# Patient Record
Sex: Female | Born: 1965 | Hispanic: Yes | Marital: Single | Smoking: Never smoker
Health system: Southern US, Community
[De-identification: ages and names within clinical notes are randomized; demographics above are authoritative.]

## PROBLEM LIST (undated history)

## (undated) DIAGNOSIS — I10 Essential (primary) hypertension: Secondary | ICD-10-CM

## (undated) DIAGNOSIS — E119 Type 2 diabetes mellitus without complications: Secondary | ICD-10-CM

## (undated) DIAGNOSIS — E7800 Pure hypercholesterolemia, unspecified: Secondary | ICD-10-CM

---

## 2004-10-18 ENCOUNTER — Emergency Department: Payer: Self-pay | Admitting: Emergency Medicine

## 2018-11-28 ENCOUNTER — Emergency Department
Admission: EM | Admit: 2018-11-28 | Discharge: 2018-11-28 | Disposition: A | Discharge status: Home / Self Care | Payer: Self-pay | Attending: Emergency Medicine | Admitting: Emergency Medicine

## 2018-11-28 ENCOUNTER — Other Ambulatory Visit: Payer: Self-pay

## 2018-11-28 ENCOUNTER — Encounter: Payer: Self-pay | Admitting: Emergency Medicine

## 2018-11-28 DIAGNOSIS — N61 Mastitis without abscess: Secondary | ICD-10-CM | POA: Insufficient documentation

## 2018-11-28 DIAGNOSIS — R6883 Chills (without fever): Secondary | ICD-10-CM | POA: Insufficient documentation

## 2018-11-28 MED ORDER — SULFAMETHOXAZOLE-TRIMETHOPRIM 800-160 MG PO TABS: 1.0000 | ORAL_TABLET | Freq: Two times a day (BID) | ORAL | 0 refills | Status: AC

## 2018-11-28 MED ORDER — CEPHALEXIN 500 MG PO CAPS: 500.0000 mg | ORAL_CAPSULE | Freq: Three times a day (TID) | ORAL | 0 refills | Status: AC

## 2018-11-28 NOTE — ED Triage Notes (Signed)
First Nurse Note:  C/O Left breast redness and pain x 3 days.  Redness seen to 1000-1200 above areola.

## 2018-11-28 NOTE — ED Provider Notes (Signed)
[facility] Emergency Department Provider Note  ____________________________________________  Time seen: Approximately 7:54 PM  I have reviewed the triage vital signs and the nursing notes.   HISTORY  Chief Complaint No chief complaint on file.    HPI Janice Colon is a 53 y.o. female presents to the emergency department with a left breast erythema and pain for the past 3 days.  Patient has had chills at home.  Patient denies a history of breast abscesses and has no history of diabetes or prior MRSA infections.  Patient has never had a nipple ring.  She denies discharge from the niple.  No personal history of breast cancer. Patient has been taking Tylenol.    History reviewed. No pertinent past medical history.  There are no active problems to display for this patient.   History reviewed. No pertinent surgical history.  Prior to Admission medications   Medication Sig Start Date End Date Taking? Authorizing Provider  cephALEXin (KEFLEX) 500 MG capsule Take 1 capsule (500 mg total) by mouth 3 (three) times daily for 7 days. 11/28/18 12/05/18  Orvil Feil, PA-C  sulfamethoxazole-trimethoprim (BACTRIM DS,SEPTRA DS) 800-160 MG tablet Take 1 tablet by mouth 2 (two) times daily for 7 days. 11/28/18 12/05/18  Orvil Feil, PA-C    Allergies Patient has no known allergies.  No family history on file.  Social History Social History   Tobacco Use  . Smoking status: Never Smoker  . Smokeless tobacco: Never Used  Substance Use Topics  . Alcohol use: Not on file  . Drug use: Not on file     Review of Systems  Constitutional: No fever/chills Eyes: No visual changes. No discharge ENT: No upper respiratory complaints. Cardiovascular: no chest pain. Respiratory: no cough. No SOB. Gastrointestinal: No abdominal pain.  No nausea, no vomiting.  No diarrhea.  No constipation. Genitourinary: Negative for dysuria. No hematuria Musculoskeletal:  Negative for musculoskeletal pain. Skin: Patient has left breast pain.  Neurological: Negative for headaches, focal weakness or numbness.   ____________________________________________   PHYSICAL EXAM:  VITAL SIGNS: ED Triage Vitals  Enc Vitals Group     BP 11/28/18 1757 (!) 156/85     Pulse Rate 11/28/18 1757 87     Resp 11/28/18 1757 18     Temp 11/28/18 1757 98.3 F (36.8 C)     Temp Source 11/28/18 1757 Oral     SpO2 11/28/18 1757 98 %     Weight 11/28/18 1749 175 lb (79.4 kg)     Height --      Head Circumference --      Peak Flow --      Pain Score 11/28/18 1749 7     Pain Loc --      Pain Edu? --      Excl. in GC? --      Constitutional: Alert and oriented. Well appearing and in no acute distress. Eyes: Conjunctivae are normal. PERRL. EOMI. Head: Atraumatic. Cardiovascular: Normal rate, regular rhythm. Normal S1 and S2.  Good peripheral circulation. Respiratory: Normal respiratory effort without tachypnea or retractions. Lungs CTAB. Good air entry to the bases with no decreased or absent breath sounds. Musculoskeletal: Full range of motion to all extremities. No gross deformities appreciated. Neurologic:  Normal speech and language. No gross focal neurologic deficits are appreciated.  Skin: Patient has 3 cm of circumferential cellulitis along the left upper quadrant of the breast superior to areola.  Mild induration is palpated but no fluctuance.  Psychiatric: Mood and affect are normal. Speech and behavior are normal. Patient exhibits appropriate insight and judgement.   ____________________________________________   LABS (all labs ordered are listed, but only abnormal results are displayed)  Labs Reviewed - No data to display ____________________________________________  EKG   ____________________________________________  RADIOLOGY I personally viewed and evaluated these images as part of my medical decision making, as well as reviewing the written  report by the radiologist.  No drainable fluid collection of left breast visualized using bedside ultrasound.  No results found.  ____________________________________________    PROCEDURES  Procedure(s) performed:    Procedures    Medications - No data to display   ____________________________________________   INITIAL IMPRESSION / ASSESSMENT AND PLAN / ED COURSE  Pertinent labs & imaging results that were available during my care of the patient were reviewed by me and considered in my medical decision making (see chart for details).  Review of the [place] CSRS was performed in accordance of the [facility] prior to dispensing any controlled drugs.      Assessment and Plan:  Left breast cellulitis Patient presents to the emergency department with erythema of the left breast for the past 3 days with chills.  I used bedside ultrasound in the emergency department and did not visualize a drainable fluid collection.  Patient was placed on Bactrim and Keflex.  Patient was given resources for the [facility] and was advised to make a follow-up appointment with [facility] if her symptoms not improve for a complete breast ultrasound. Patient voiced understanding using Engineer, structural.  All patient questions were answered.   ____________________________________________  FINAL CLINICAL IMPRESSION(S) / ED DIAGNOSES  Final diagnoses:  Cellulitis of breast      NEW MEDICATIONS STARTED DURING THIS VISIT:  ED Discharge Orders         Ordered    sulfamethoxazole-trimethoprim (BACTRIM DS,SEPTRA DS) 800-160 MG tablet  2 times daily     11/28/18 2004    cephALEXin (KEFLEX) 500 MG capsule  3 times daily     11/28/18 2004              This chart was dictated using voice recognition software/Dragon. Despite best efforts to proofread, errors can occur which can change the meaning. Any change was purely unintentional.    Gasper Lloyd 11/28/18  2032    Rockne Menghini, MD 11/28/18 [id number]

## 2020-09-24 ENCOUNTER — Other Ambulatory Visit: Payer: Self-pay

## 2020-09-24 ENCOUNTER — Ambulatory Visit (LOCAL_COMMUNITY_HEALTH_CENTER): Payer: Self-pay

## 2020-09-24 VITALS — Wt 182.0 lb

## 2020-09-24 DIAGNOSIS — Z201 Contact with and (suspected) exposure to tuberculosis: Secondary | ICD-10-CM

## 2020-09-24 NOTE — Progress Notes (Signed)
Reports menses since early 08/2020 and reports this has happened before when living in [place]. At that time, provider prescribed some birth control pills which she took to regulate her period. Client reports purchased same pills at the [place] store and plans to begin taking. Encouraged appt with provider for evaluation (age 54 years old). HIV consent signed and sent for scanning. Jossie Ng, RN

## 2020-09-27 LAB — QUANTIFERON-TB GOLD PLUS
QuantiFERON Mitogen Value: >10 IU/mL
QuantiFERON Nil Value: 0.41 IU/mL
QuantiFERON TB1 Ag Value: 1.9 IU/mL
QuantiFERON TB2 Ag Value: 1.88 IU/mL
QuantiFERON-TB Gold Plus: POSITIVE — AB

## 2020-10-02 ENCOUNTER — Ambulatory Visit (LOCAL_COMMUNITY_HEALTH_CENTER): Payer: Self-pay

## 2020-10-02 ENCOUNTER — Other Ambulatory Visit: Payer: Self-pay | Admitting: Family Medicine

## 2020-10-02 VITALS — Wt 182.0 lb

## 2020-10-02 DIAGNOSIS — R7612 Nonspecific reaction to cell mediated immunity measurement of gamma interferon antigen response without active tuberculosis: Secondary | ICD-10-CM

## 2020-10-02 NOTE — Progress Notes (Signed)
TC with patient. Informed of +QFT and need for CXR.  Discussed LTBI vs Active TB.  Patient is interested in LTBI tx.Richmond Campbell, RN

## 2020-10-03 ENCOUNTER — Telehealth: Payer: Self-pay

## 2020-10-03 ENCOUNTER — Ambulatory Visit
Admission: RE | Admit: 2020-10-03 | Discharge: 2020-10-03 | Disposition: A | Discharge status: Home / Self Care | Payer: Self-pay | Attending: Family Medicine | Admitting: Family Medicine

## 2020-10-03 ENCOUNTER — Ambulatory Visit
Admission: RE | Admit: 2020-10-03 | Discharge: 2020-10-03 | Disposition: A | Discharge status: Home / Self Care | Payer: Self-pay | Source: Ambulatory Visit | Attending: Family Medicine | Admitting: Family Medicine

## 2020-10-03 DIAGNOSIS — R7612 Nonspecific reaction to cell mediated immunity measurement of gamma interferon antigen response without active tuberculosis: Secondary | ICD-10-CM | POA: Insufficient documentation

## 2020-10-03 NOTE — Telephone Encounter (Signed)
TC to patient re: +QFT and need for CXR.  Discussed LTBI vs Active TB.  Patient wishes to proceed with LTBI tx if CXR normal. Richmond Campbell, RN

## 2020-10-04 ENCOUNTER — Other Ambulatory Visit: Payer: Self-pay

## 2020-10-04 ENCOUNTER — Ambulatory Visit (LOCAL_COMMUNITY_HEALTH_CENTER): Payer: Self-pay

## 2020-10-04 VITALS — Wt 182.0 lb

## 2020-10-04 DIAGNOSIS — R7612 Nonspecific reaction to cell mediated immunity measurement of gamma interferon antigen response without active tuberculosis: Secondary | ICD-10-CM

## 2020-10-04 MED ORDER — RIFAMPIN 300 MG PO CAPS: 600.0000 mg | ORAL_CAPSULE | Freq: Every day | ORAL | 0 refills | Status: AC

## 2020-10-04 NOTE — Progress Notes (Signed)
Tuberculosis treatment orders  All patients are to be monitored per [place] and county TB policies.   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 Treat for latent TB per the following:  Rifampin 600mg  daily PO x 4 months.   Reports taking OCPs from the " store"' unsure of name. No other medications or medical hx per patient.

## 2020-10-04 NOTE — Progress Notes (Signed)
Rifampin 300mg #60 dispensed per [facility] order. Jyair Kiraly, RN  

## 2020-10-12 ENCOUNTER — Emergency Department: Payer: Self-pay

## 2020-10-12 ENCOUNTER — Encounter: Payer: Self-pay | Admitting: Emergency Medicine

## 2020-10-12 ENCOUNTER — Emergency Department
Admission: EM | Admit: 2020-10-12 | Discharge: 2020-10-12 | Disposition: A | Discharge status: Home / Self Care | Payer: Self-pay | Attending: Emergency Medicine | Admitting: Emergency Medicine

## 2020-10-12 ENCOUNTER — Other Ambulatory Visit: Payer: Self-pay

## 2020-10-12 DIAGNOSIS — N938 Other specified abnormal uterine and vaginal bleeding: Secondary | ICD-10-CM | POA: Insufficient documentation

## 2020-10-12 DIAGNOSIS — N939 Abnormal uterine and vaginal bleeding, unspecified: Secondary | ICD-10-CM

## 2020-10-12 DIAGNOSIS — N760 Acute vaginitis: Secondary | ICD-10-CM | POA: Insufficient documentation

## 2020-10-12 LAB — COMPREHENSIVE METABOLIC PANEL
ALT: 18 U/L (ref 0–44)
AST: 26 U/L (ref 15–41)
Albumin: 3.9 g/dL (ref 3.5–5.0)
Alkaline Phosphatase: 61 U/L (ref 38–126)
Anion gap: 11 (ref 5–15)
BUN: 9 mg/dL (ref 6–20)
CO2: 22 mmol/L (ref 22–32)
Calcium: 8.8 mg/dL — ABNORMAL LOW (ref 8.9–10.3)
Chloride: 104 mmol/L (ref 98–111)
Creatinine, Ser: 0.58 mg/dL (ref 0.44–1.00)
GFR, Estimated: >60 mL/min (ref >60)
Glucose, Bld: 190 mg/dL — ABNORMAL HIGH (ref 70–99)
Potassium: 4 mmol/L (ref 3.5–5.1)
Sodium: 137 mmol/L (ref 135–145)
Total Bilirubin: 0.3 mg/dL (ref 0.3–1.2)
Total Protein: 8 g/dL (ref 6.5–8.1)

## 2020-10-12 LAB — CBC
HCT: 39.8 % (ref 36.0–46.0)
Hemoglobin: 13.3 g/dL (ref 12.0–15.0)
MCH: 29.7 pg (ref 26.0–34.0)
MCHC: 33.4 g/dL (ref 30.0–36.0)
MCV: 88.8 fL (ref 80.0–100.0)
Platelets: 229 10*3/uL (ref 150–400)
RBC: 4.48 MIL/uL (ref 3.87–5.11)
RDW: 12.8 % (ref 11.5–15.5)
WBC: 5.8 10*3/uL (ref 4.0–10.5)
nRBC: 0 % (ref 0.0–0.2)

## 2020-10-12 LAB — WET PREP, GENITAL
Sperm: NONE SEEN
Trich, Wet Prep: NONE SEEN
Yeast Wet Prep HPF POC: NONE SEEN

## 2020-10-12 LAB — CHLAMYDIA/NGC RT PCR (ARMC ONLY)
Chlamydia Tr: NOT DETECTED
N gonorrhoeae: NOT DETECTED

## 2020-10-12 MED ORDER — METRONIDAZOLE 500 MG PO TABS: 500.0000 mg | ORAL_TABLET | Freq: Two times a day (BID) | ORAL | 0 refills | Status: DC

## 2020-10-12 NOTE — ED Provider Notes (Signed)
[facility] Emergency Department Provider Note  ___________________________________________   None    (approximate)  I have reviewed the triage vital signs and the nursing notes.  Per Spanish interpreter   HISTORY  Chief Complaint Vaginal Bleeding  HPI Janice Colon is a 54 y.o. female presents to the ED with history of vaginal bleeding for the last 2 months.  Patient states that she uses multiple pads per day and also has passed clots.  She has been taking over-the-counter birth control pills to help control her periods that were obtained at a [place] store.  She has not been taking any medications prior to or while having vaginal bleeding.  She denies any pain or vaginal discharge prior to the beginning of her vaginal bleeding.  She has not been evaluated by medical for this condition since it began.       History reviewed. No pertinent past medical history.  Patient Active Problem List   Diagnosis Date Noted  . 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 No pertinent surgical history.  Prior to Admission medications   Medication Sig Start Date End Date Taking? Authorizing Provider  albuterol (VENTOLIN HFA) 108 (90 Base) MCG/ACT inhaler Inhale into the lungs every 6 (six) hours as needed for wheezing or shortness of breath.    [provider]  metroNIDAZOLE (FLAGYL) 500 MG tablet Take 1 tablet (500 mg total) by mouth 2 (two) times daily. 10/12/20   Tommi Rumps, PA-C  rifampin (RIFADIN) 300 MG capsule Take 2 capsules (600 mg total) by mouth daily. 10/04/20 11/03/20  Federico Flake, MD    Allergies Patient has no known allergies.  History reviewed. No pertinent family history.  Social History Social History   Tobacco Use  . Smoking status: Never Smoker  . Smokeless tobacco: Never Used  Vaping Use  . Vaping Use: Never used  Substance Use Topics   . Alcohol use: Not Currently  . Drug use: Never    Review of Systems Constitutional: No fever/chills Eyes: No visual changes. ENT: No sore throat. Cardiovascular: Denies chest pain. Respiratory: Denies shortness of breath. Gastrointestinal: No abdominal pain.  No nausea, no vomiting.  No diarrhea.  Genitourinary: Negative for dysuria.  Positive for vaginal bleeding. Musculoskeletal: Negative for back pain. Skin: Negative for rash. Neurological: Negative for headaches, focal weakness or numbness. ___________________________________________   PHYSICAL EXAM:  VITAL SIGNS: ED Triage Vitals  Enc Vitals Group     BP 10/12/20 1336 (!) 147/101     Pulse Rate 10/12/20 1336 95     Resp 10/12/20 1336 18     Temp 10/12/20 1336 98.7 F (37.1 C)     Temp src --      SpO2 10/12/20 1336 97 %     Weight 10/12/20 1342 180 lb (81.6 kg)     Height 10/12/20 1342 5\' 5"  (1.651 m)     Head Circumference --      Peak Flow --      Pain Score --      Pain Loc --      Pain Edu? --      Excl. in GC? --     Constitutional: Alert and oriented. Well appearing and in no acute distress. Eyes: Conjunctivae are normal.  Head: Atraumatic. Neck: No stridor.   Cardiovascular: Normal rate, regular rhythm. Grossly normal heart sounds.  Good peripheral circulation. Respiratory: Normal respiratory effort.  No retractions. Lungs  CTAB. Gastrointestinal: Soft and nontender. No distention. Genitourinary: Unremarkable external genitalia.  There is some minimal blood noted in the vaginal vault but no active bleeding at this time.  No adnexal masses or tenderness noted.  No cervical motion tenderness present. Musculoskeletal: Moves upper and lower extremities they have difficulty.  No edema is noted to the lower extremities.  Patient is ambulatory without any assistance. Neurologic:  Normal speech and language. No gross focal neurologic deficits are appreciated. No gait instability. Skin:  Skin is warm, dry and  intact. No rash noted. Psychiatric: Mood and affect are normal. Speech and behavior are normal.  ____________________________________________   LABS (all labs ordered are listed, but only abnormal results are displayed)  Labs Reviewed  WET PREP, GENITAL - Abnormal; Notable for the following components:      Result Value   Clue Cells Wet Prep HPF POC PRESENT (*)    WBC, Wet Prep HPF POC FEW (*)    All other components within normal limits  COMPREHENSIVE METABOLIC PANEL - Abnormal; Notable for the following components:   Glucose, Bld 190 (*)    Calcium 8.8 (*)    All other components within normal limits  CHLAMYDIA/NGC RT PCR ([facility] ONLY)  CBC  POC URINE PREG, ED     RADIOLOGY I, Tommi Rumps, personally viewed and evaluated these images (plain radiographs) as part of my medical decision making, as well as reviewing the written report by the radiologist.   Official radiology report(s): US PELVIC COMPLETE WITH TRANSVAGINAL  Result Date: 10/12/2020 CLINICAL DATA:  Vaginal bleeding EXAM: TRANSABDOMINAL AND TRANSVAGINAL ULTRASOUND OF PELVIS DOPPLER ULTRASOUND OF OVARIES TECHNIQUE: Both transabdominal and transvaginal ultrasound examinations of the pelvis were performed. Transabdominal technique was performed for global imaging of the pelvis including uterus, ovaries, adnexal regions, and pelvic cul-de-sac. It was necessary to proceed with endovaginal exam following the transabdominal exam to visualize the uterus, ovaries, and adnexa. Color and duplex Doppler ultrasound was utilized to evaluate blood flow to the ovaries. COMPARISON:  None. FINDINGS: Uterus Measurements: 9.2 x 4.4 x 5.6 cm = volume: 124 mL. No fibroids or other mass visualized. Endometrium Thickness: 3 mm.  No focal abnormality visualized. Right ovary Measurements: 2.1 x 1.2 x 1.4 cm = volume: 2 mL. Normal appearance/no adnexal mass. Left ovary Measurements: 1.2 x 0.7 x 1.1 cm = volume: 1 mL. Normal appearance/no  adnexal mass. Pulsed Doppler evaluation of both ovaries demonstrates normal low-resistance arterial and venous waveforms. Other findings No abnormal free fluid. IMPRESSION: No ultrasound abnormality of the pelvis to explain vaginal bleeding. Electronically Signed   By: Lauralyn Primes M.D.   On: 10/12/2020 18:12    ____________________________________________   PROCEDURES  Procedure(s) performed (including Critical Care):  Procedures   ____________________________________________   INITIAL IMPRESSION / ASSESSMENT AND PLAN / ED COURSE  As part of my medical decision making, I reviewed the following data within the electronic MEDICAL RECORD NUMBER Notes from prior ED visits and [place] Controlled Substance Database  54 year old female presents to the ED with history of vaginal bleeding for the last 2 months.  She has been trying to control this with birth control pills that she obtained at a [place] market.  She states that the bleeding is every day however she denies any dizziness, shortness of breath or weakness.  Ultrasound was negative and patient was reassured.  Lab results shows a stable hemoglobin of 13.3/hematocrit 39.8.  Wet prep revealed clue cells and patient was made aware that bacterial vaginosis is  very common and not considered a STD.  At the time of discharge Chlamydia gonorrhea test had not resulted and patient is aware that she will be contacted if they are positive.  A prescription for Flagyl was sent to her pharmacy.  Patient is to follow-up with Dr. Feliberto Gottron who is on-call for gynecology for further evaluation if this does not improve.  Also blood pressure was elevated while in the ED and patient denies any previous history of hypertension.  She is to follow-up with Dr. Jacelyn Pi and have her blood pressure rechecked.  Discharge information was given to patient with interpreter present.  Patient agrees and understands.  ____________________________________________   FINAL CLINICAL  IMPRESSION(S) / ED DIAGNOSES  Final diagnoses:  Vaginal bleeding  BV (bacterial vaginosis)  Dysfunctional uterine bleeding     ED Discharge Orders         Ordered    metroNIDAZOLE (FLAGYL) 500 MG tablet  2 times daily        10/12/20 1846          *Please note:  Cristy Hilts [name] was evaluated in Emergency Department on 10/12/2020 for the symptoms described in the history of present illness. She was evaluated in the context of the global COVID-19 pandemic, which necessitated consideration that the patient might be at risk for infection with the SARS-CoV-2 virus that causes COVID-19. Institutional protocols and algorithms that pertain to the evaluation of patients at risk for COVID-19 are in a state of rapid change based on information released by regulatory bodies including the CDC and federal and state organizations. These policies and algorithms were followed during the patient's care in the ED.  Some ED evaluations and interventions may be delayed as a result of limited staffing during and the pandemic.*   Note:  This document was prepared using Dragon voice recognition software and may include unintentional dictation errors.    Tommi Rumps, PA-C 10/12/20 1906    Delton Prairie, MD 10/13/20 1025

## 2020-10-12 NOTE — ED Triage Notes (Signed)
PT to ER with c/o vaginal bleeding for last 2 months.  PT reports she is passing clots.  PT denies previous medical attention for same.

## 2020-10-12 NOTE — Discharge Instructions (Addendum)
Follow up with Dr. Jacelyn Pi for blood pressure. If any continued vaginal bleeding follow-up with Dr. Feliberto Gottron who is the gynecologist that we talked about. Take antibiotic until completely finished.

## 2020-10-12 NOTE — ED Notes (Signed)
Ambulatory to treatment room, interpreter requested for assessment

## 2020-10-29 ENCOUNTER — Ambulatory Visit (LOCAL_COMMUNITY_HEALTH_CENTER): Payer: Self-pay

## 2020-10-29 ENCOUNTER — Other Ambulatory Visit: Payer: Self-pay

## 2020-10-29 VITALS — Wt 185.0 lb

## 2020-10-29 DIAGNOSIS — R7612 Nonspecific reaction to cell mediated immunity measurement of gamma interferon antigen response without active tuberculosis: Secondary | ICD-10-CM

## 2020-10-29 MED ORDER — RIFAMPIN 300 MG PO CAPS: 600.0000 mg | ORAL_CAPSULE | Freq: Every day | ORAL | 0 refills | Status: AC

## 2020-10-29 NOTE — Addendum Note (Signed)
Addended by: Richmond Campbell on: 10/29/2020 04:20 PM   Modules accepted: Orders

## 2020-10-29 NOTE — Progress Notes (Signed)
Rifampin 300mg  #60 dispensed per [facility] order.  Patient denies any changes or problems since last visit. [facility], RN

## 2020-12-10 ENCOUNTER — Ambulatory Visit (LOCAL_COMMUNITY_HEALTH_CENTER): Payer: Self-pay

## 2020-12-10 ENCOUNTER — Other Ambulatory Visit: Payer: Self-pay

## 2020-12-10 VITALS — Wt 184.0 lb

## 2020-12-10 DIAGNOSIS — R7612 Nonspecific reaction to cell mediated immunity measurement of gamma interferon antigen response without active tuberculosis: Secondary | ICD-10-CM

## 2020-12-10 MED ORDER — RIFAMPIN 300 MG PO CAPS: 600.0000 mg | ORAL_CAPSULE | Freq: Every day | ORAL | 0 refills | Status: AC

## 2020-12-10 NOTE — Progress Notes (Unsigned)
Rifampin 300mg  #60 disp to patient per [facility] order. [facility], RN

## 2021-01-16 ENCOUNTER — Telehealth: Payer: Self-pay

## 2021-02-17 NOTE — Telephone Encounter (Signed)
TB Closure letter sent to patient. Richmond Campbell, RN

## 2021-06-30 ENCOUNTER — Emergency Department
Admission: EM | Admit: 2021-06-30 | Discharge: 2021-06-30 | Disposition: A | Discharge status: Home / Self Care | Payer: Self-pay | Attending: Emergency Medicine | Admitting: Emergency Medicine

## 2021-06-30 DIAGNOSIS — L0100 Impetigo, unspecified: Secondary | ICD-10-CM | POA: Insufficient documentation

## 2021-06-30 DIAGNOSIS — Z8616 Personal history of COVID-19: Secondary | ICD-10-CM | POA: Insufficient documentation

## 2021-06-30 MED ORDER — SULFAMETHOXAZOLE-TRIMETHOPRIM 800-160 MG PO TABS: 1.0000 | ORAL_TABLET | Freq: Two times a day (BID) | ORAL | 0 refills | Status: AC

## 2021-06-30 MED ORDER — MUPIROCIN CALCIUM 2 % EX CREA: 1.0000 "application " | TOPICAL_CREAM | Freq: Two times a day (BID) | CUTANEOUS | 0 refills | Status: AC

## 2021-06-30 NOTE — ED Provider Notes (Addendum)
[facility] Emergency Department Provider Note  ____________________________________________   Event Date/Time   First MD Initiated Contact with Patient 06/30/21 1424     (approximate)  I have reviewed the triage vital signs and the nursing notes.   HISTORY  Chief Complaint Rash    HPI Janice Colon is a 55 y.o. female with history of COVID who comes in with concern for rash.  Patient reports that tomorrow is her last day of quarantine for her COVID.  She states that her COVID symptoms have been resolving but she has a rash that started underneath her nose.  She states has been itching and that there has been some leaking of fluid coming out of it so she wanted to come in today for to be evaluated.  Denies any fevers currently.  Patient reports that its been there for 4 days now and gradually getting slightly worse.          No past medical history on file.  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  Prior to Admission medications   Medication Sig Start Date End Date Taking? Authorizing Provider  mupirocin cream (BACTROBAN) 2 % Apply 1 application topically 2 (two) times daily for 5 days. 06/30/21 07/05/21 Yes Concha Se, MD  sulfamethoxazole-trimethoprim (BACTRIM DS) 800-160 MG tablet Take 1 tablet by mouth 2 (two) times daily for 5 days. 06/30/21 07/05/21 Yes Concha Se, MD  albuterol (VENTOLIN HFA) 108 (90 Base) MCG/ACT inhaler Inhale into the lungs every 6 (six) hours as needed for wheezing or shortness of breath.    [provider]  metroNIDAZOLE (FLAGYL) 500 MG tablet Take 1 tablet (500 mg total) by mouth 2 (two) times daily. 10/12/20   Tommi Rumps, PA-C    Allergies Patient has no known allergies.  No family history on file.  Social History Social History   Tobacco Use    Smoking status: Never   Smokeless tobacco: Never  Vaping Use   Vaping Use: Never used  Substance Use Topics   Alcohol use: Not Currently   Drug use: Never      Review of Systems Constitutional: No fever/chills Eyes: No visual changes. ENT: No sore throat. Cardiovascular: Denies chest pain. Respiratory: Denies shortness of breath. Gastrointestinal: No abdominal pain.  No nausea, no vomiting.  No diarrhea.  No constipation. Genitourinary: Negative for dysuria. Musculoskeletal: Negative for back pain. Skin: Positive rash Neurological: Negative for headaches, focal weakness or numbness. All other ROS negative ____________________________________________   PHYSICAL EXAM:  VITAL SIGNS: ED Triage Vitals  Enc Vitals Group     BP 06/30/21 1343 (!) 165/88     Pulse Rate 06/30/21 1343 83     Resp 06/30/21 1343 16     Temp 06/30/21 1343 98.4 F (36.9 C)     Temp Source 06/30/21 1343 Oral     SpO2 06/30/21 1343 97 %     Weight 06/30/21 1342 184 lb 1.4 oz (83.5 kg)     Height 06/30/21 1342 5\' 5"  (1.651 m)     Head Circumference --      Peak Flow --      Pain Score 06/30/21 1342 0     Pain Loc --      Pain Edu? --      Excl. in GC? --     Constitutional:  Alert and oriented. Well appearing and in no acute distress. Eyes: Conjunctivae are normal. EOMI. Head: Atraumatic. Nose: No congestion/rhinnorhea.  Patient has dime sized crusty rash noted underneath the left nostril Mouth/Throat: Mucous membranes are moist.   Neck: No stridor. Trachea Midline. FROM Cardiovascular: Normal rate, regular rhythm. Grossly normal heart sounds.  Good peripheral circulation. Respiratory: Normal respiratory effort.  No retractions. Lungs CTAB. Gastrointestinal: Soft and nontender. No distention. No abdominal bruits.  Musculoskeletal: No lower extremity tenderness nor edema.  No joint effusions. Neurologic:  Normal speech and language. No gross focal neurologic deficits are appreciated.  Skin:   Skin is warm, dry and intact. No rash noted. Psychiatric: Mood and affect are normal. Speech and behavior are normal. GU: Deferred   _____ INITIAL IMPRESSION / ASSESSMENT AND PLAN / ED COURSE  JAMIESON LISA [name] was evaluated in Emergency Department on 06/30/2021 for the symptoms described in the history of present illness. She was evaluated in the context of the global COVID-19 pandemic, which necessitated consideration that the patient might be at risk for infection with the SARS-CoV-2 virus that causes COVID-19. Institutional protocols and algorithms that pertain to the evaluation of patients at risk for COVID-19 are in a state of rapid change based on information released by regulatory bodies including the CDC and federal and state organizations. These policies and algorithms were followed during the patient's care in the ED.     Rash is concerning for potential impetigo.  Will cover with some oral Bactrim in case this area is a MRSA infection as well as some Bactroban.  Patient not on any medications and creatinine was recently checked in the last year and was normal.  Do not feel like this rash is consistent with shingles and she is already 3 days out.  Do not feel like she is having any other systemic symptoms to suggest more severe illness there is no other rash anywhere besides a small dime size location. This is not consistent with monkey pox.       ____________________________________________   FINAL CLINICAL IMPRESSION(S) / ED DIAGNOSES   Final diagnoses:  Impetigo      MEDICATIONS GIVEN DURING THIS VISIT:  Medications - No data to display   ED Discharge Orders          Ordered    sulfamethoxazole-trimethoprim (BACTRIM DS) 800-160 MG tablet  2 times daily        06/30/21 1539    mupirocin cream (BACTROBAN) 2 %  2 times daily        06/30/21 1539             Note:  This document was prepared using Dragon voice recognition software and may include  unintentional dictation errors.    Concha Se, MD 06/30/21 1549    Concha Se, MD 06/30/21 [phone]

## 2021-06-30 NOTE — ED Triage Notes (Signed)
Pt here with a rash under her nose. Pt has covid and is on the last day of her quarantine and wants to be evaluated.

## 2021-06-30 NOTE — Discharge Instructions (Addendum)
Take the antibiotics and rub the cream on it to help clear up the area.  Return to the ER for start spreading onto other parts your face, fevers or any other concerns Debrox to help with ear wax

## 2021-09-26 IMAGING — CR DG CHEST 1V
1 series · 1 of 1 positions shown · non-contrast
Comparison: None.

CLINICAL DATA: Positive QuantiFERON gold test.

EXAM:
CHEST  1 VIEW

[dg chest 1 view]
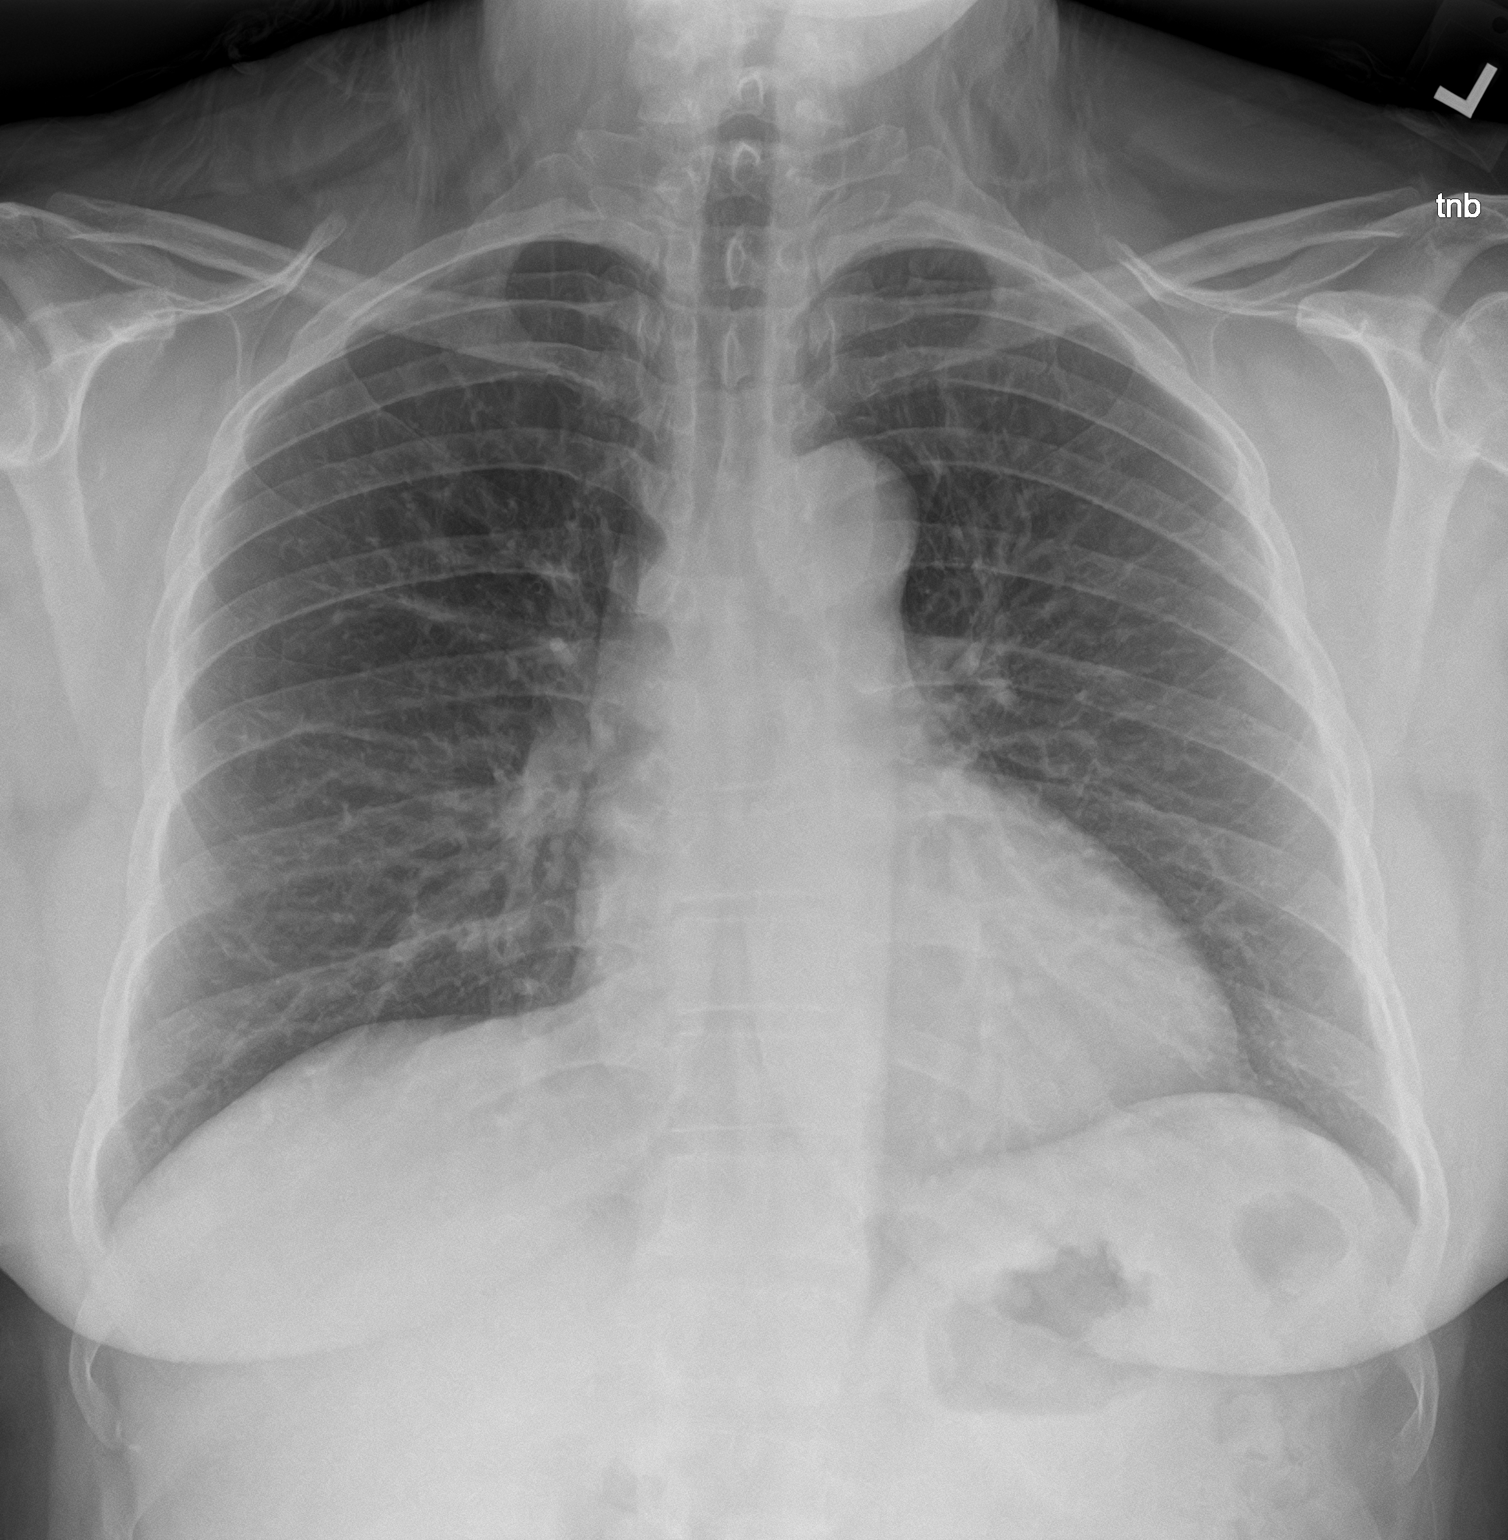

[1 of 1 positions shown; findings below may reference images not displayed]

FINDINGS: Lungs are clear. Heart is upper normal in size with pulmonary
vascularity normal. No adenopathy. No bone lesions.
IMPRESSION: No findings indicative of metabolically active tuberculosis. Lungs
clear. Heart upper normal in size. No adenopathy.

## 2021-10-05 IMAGING — US US PELVIS COMPLETE WITH TRANSVAGINAL
1 series · 13 of 25 positions shown · non-contrast
Comparison: None.

CLINICAL DATA: Vaginal bleeding

EXAM:
TRANSABDOMINAL AND TRANSVAGINAL ULTRASOUND OF PELVIS
DOPPLER ULTRASOUND OF OVARIES
TECHNIQUE: Both transabdominal and transvaginal ultrasound examinations of the
pelvis were performed. Transabdominal technique was performed for
global imaging of the pelvis including uterus, ovaries, adnexal
regions, and pelvic cul-de-sac.
It was necessary to proceed with endovaginal exam following the
transabdominal exam to visualize the uterus, ovaries, and adnexa.
Color and duplex Doppler ultrasound was utilized to evaluate blood
flow to the ovaries.

[Series 1: us pelvic complete with transvaginal · 71 acquisitions, 13 frames shown]
[im 1/71]
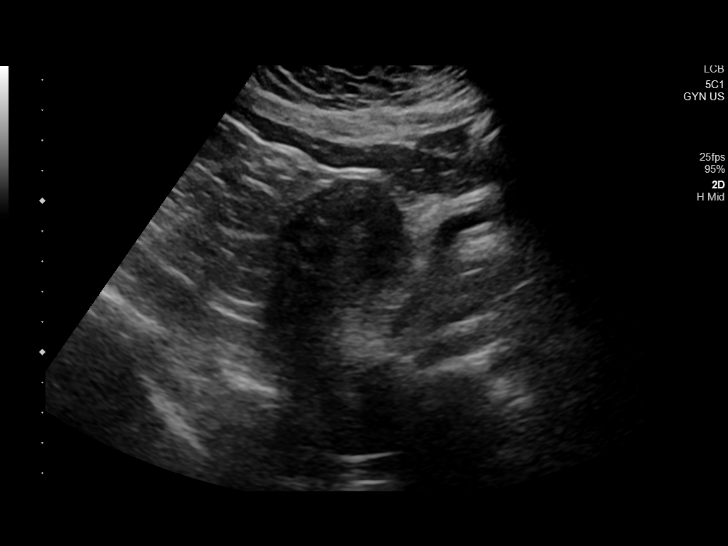
[im 6/71]
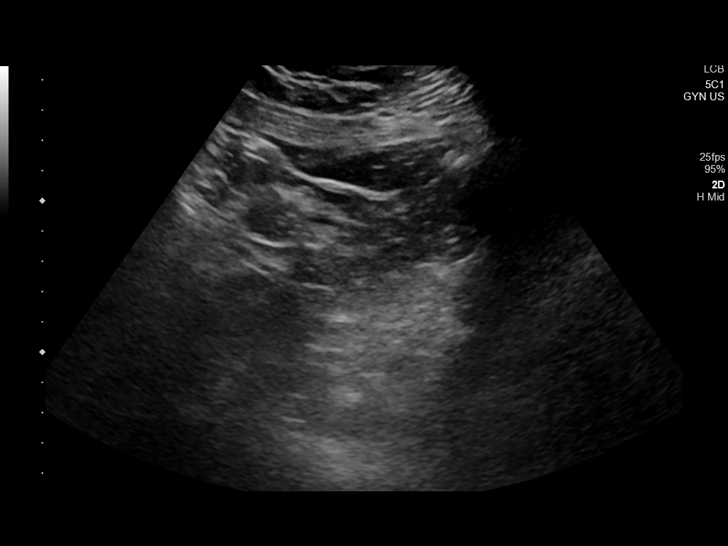
[im 12/71]
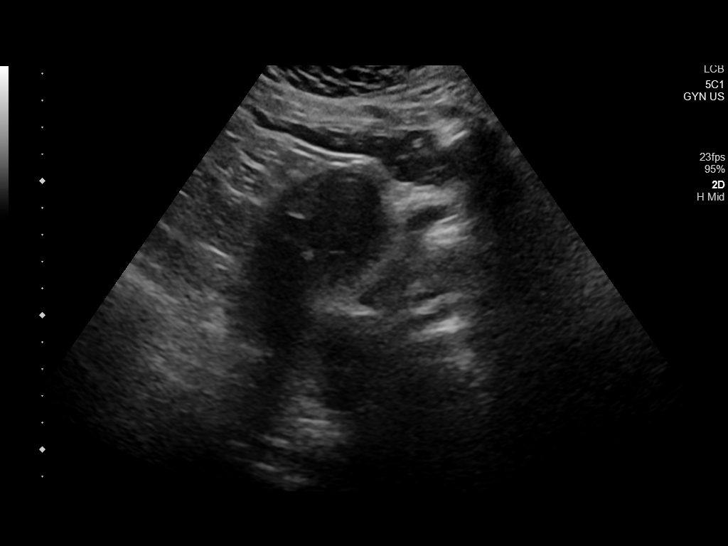
[im 18/71]
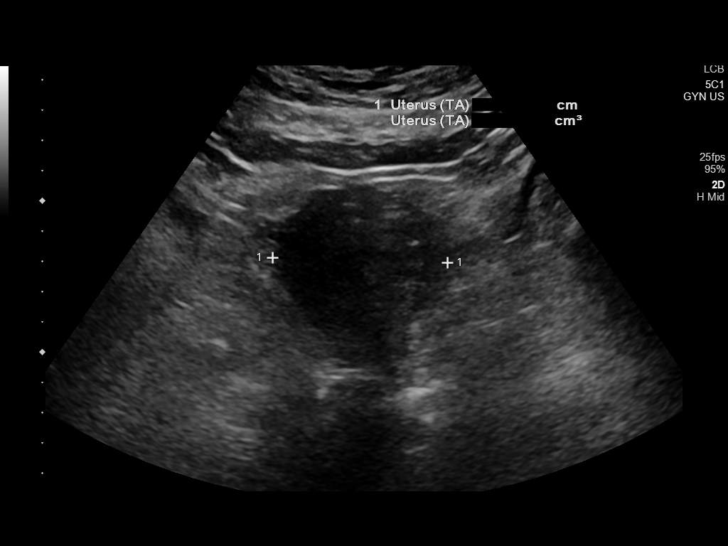
[im 24/71]
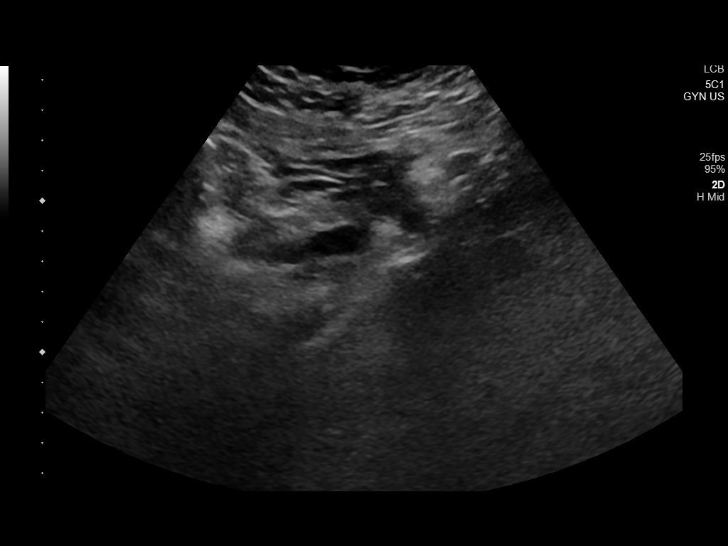
[im 30/71]
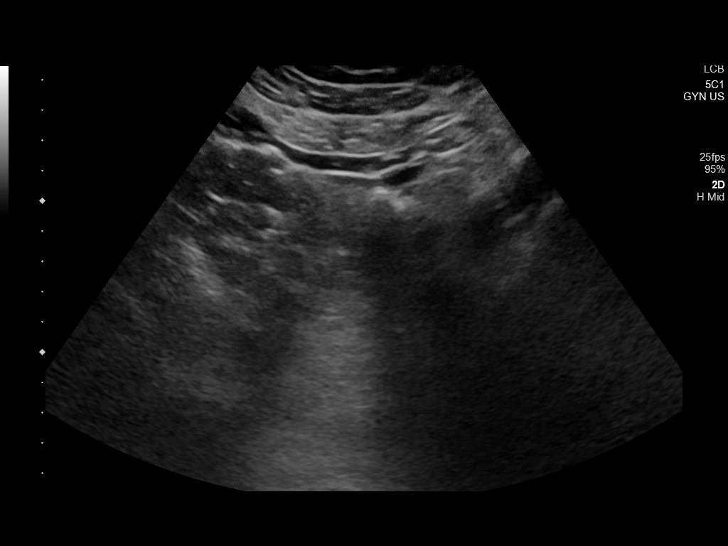
[im 36/71]
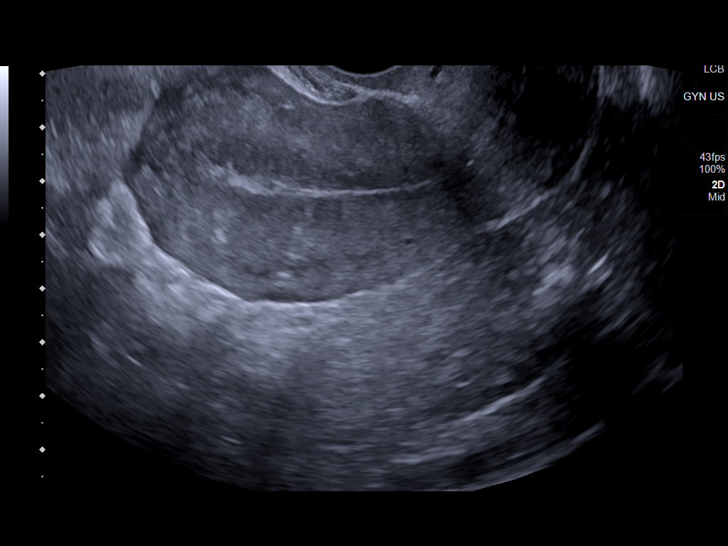
[im 41/71]
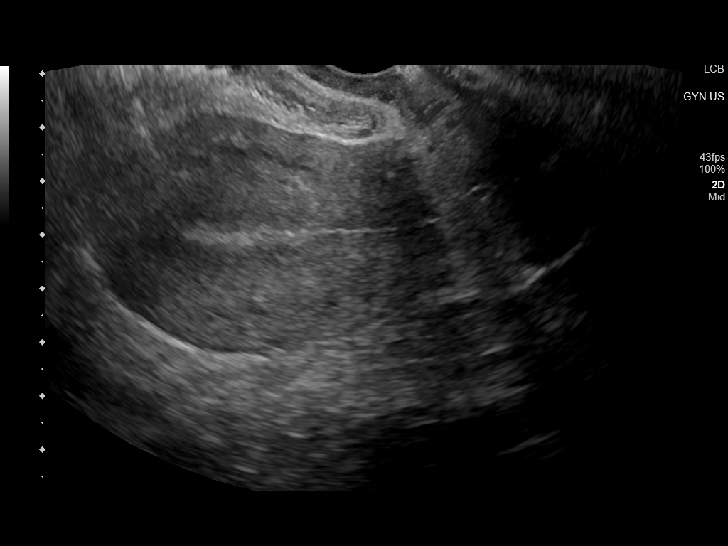
[im 47/71]
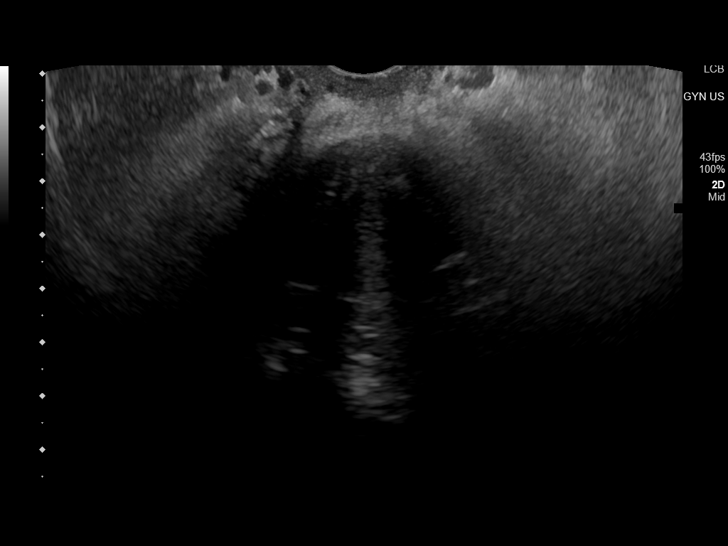
[im 53/71]
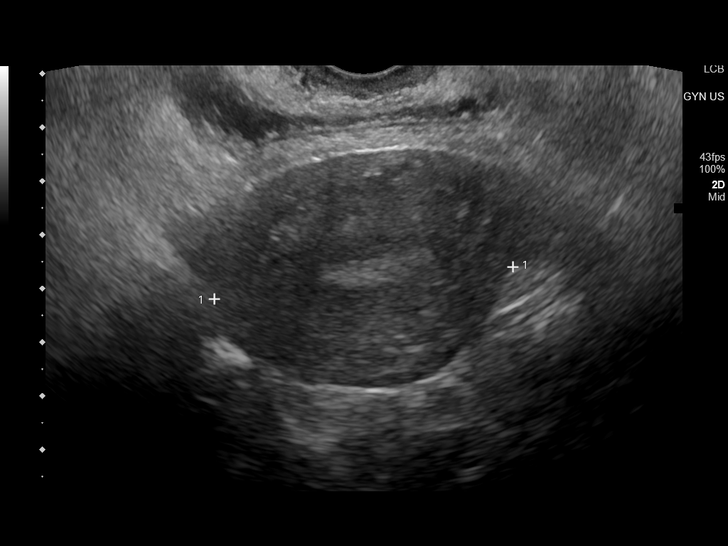
[im 59/71]
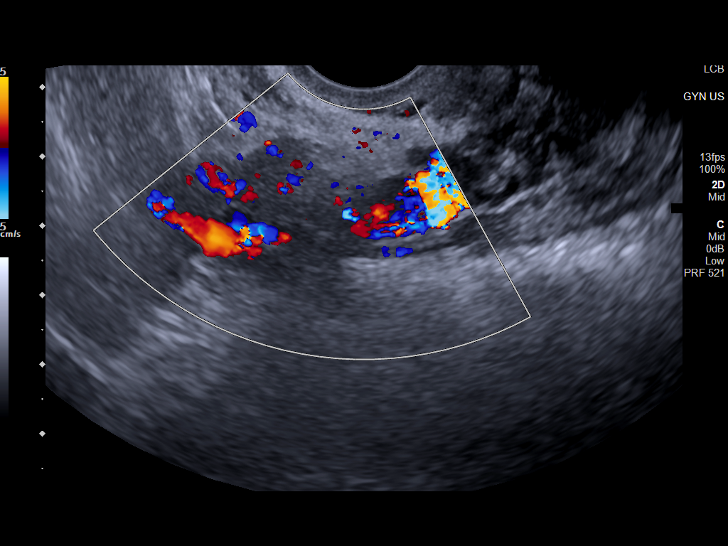
[im 65/71]
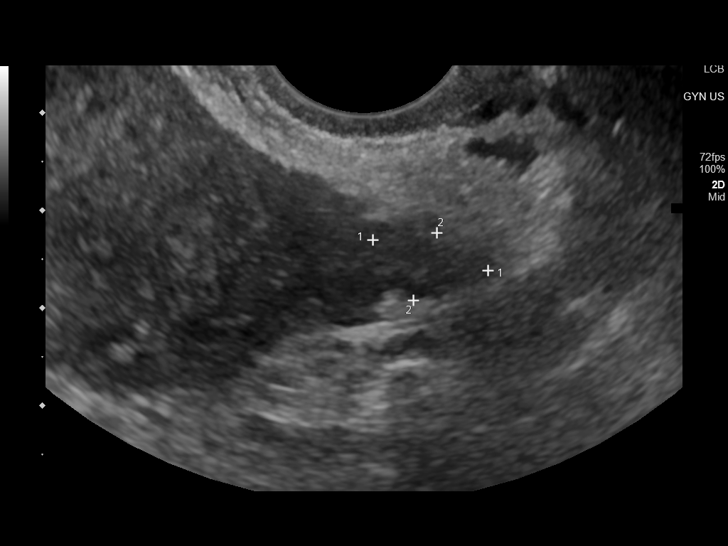
[im 71/71]
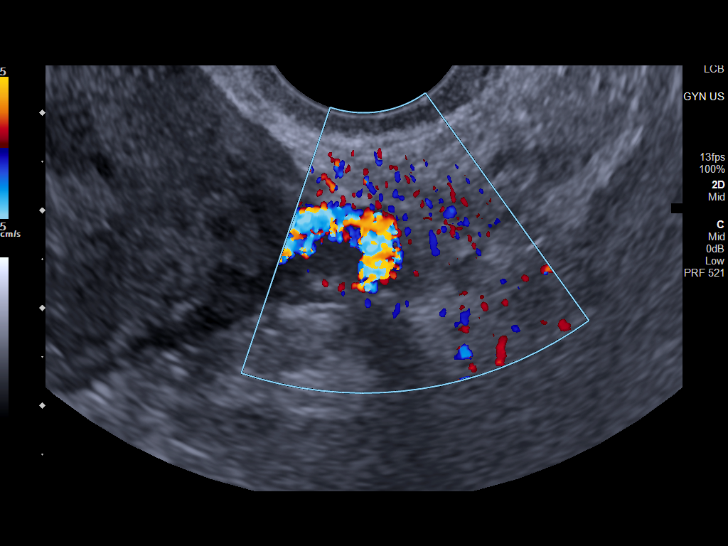

[13 of 25 positions shown; findings below may reference images not displayed]

FINDINGS: Uterus

Measurements: 9.2 x 4.4 x 5.6 cm = volume: 124 mL. No fibroids or
other mass visualized.

Endometrium

Thickness: 3 mm.  No focal abnormality visualized.

Right ovary

Measurements: 2.1 x 1.2 x 1.4 cm = volume: 2 mL. Normal
appearance/no adnexal mass.

Left ovary

Measurements: 1.2 x 0.7 x 1.1 cm = volume: 1 mL. Normal
appearance/no adnexal mass.

Pulsed Doppler evaluation of both ovaries demonstrates normal
low-resistance arterial and venous waveforms.

Other findings

No abnormal free fluid.
IMPRESSION: No ultrasound abnormality of the pelvis to explain vaginal bleeding.

## 2022-10-19 ENCOUNTER — Encounter: Payer: Self-pay | Admitting: Emergency Medicine

## 2022-10-19 ENCOUNTER — Emergency Department: Payer: Self-pay

## 2022-10-19 ENCOUNTER — Other Ambulatory Visit: Payer: Self-pay

## 2022-10-19 ENCOUNTER — Emergency Department
Admission: EM | Admit: 2022-10-19 | Discharge: 2022-10-19 | Disposition: A | Discharge status: Home / Self Care | Payer: Self-pay | Attending: Student in an Organized Health Care Education/Training Program | Admitting: Student in an Organized Health Care Education/Training Program

## 2022-10-19 DIAGNOSIS — B974 Respiratory syncytial virus as the cause of diseases classified elsewhere: Secondary | ICD-10-CM | POA: Insufficient documentation

## 2022-10-19 DIAGNOSIS — B338 Other specified viral diseases: Secondary | ICD-10-CM

## 2022-10-19 DIAGNOSIS — Z1152 Encounter for screening for COVID-19: Secondary | ICD-10-CM | POA: Insufficient documentation

## 2022-10-19 DIAGNOSIS — R0981 Nasal congestion: Secondary | ICD-10-CM | POA: Insufficient documentation

## 2022-10-19 LAB — CBC
HCT: 45 % (ref 36.0–46.0)
Hemoglobin: 14.5 g/dL (ref 12.0–15.0)
MCH: 28 pg (ref 26.0–34.0)
MCHC: 32.2 g/dL (ref 30.0–36.0)
MCV: 86.9 fL (ref 80.0–100.0)
Platelets: 207 10*3/uL (ref 150–400)
RBC: 5.18 MIL/uL — ABNORMAL HIGH (ref 3.87–5.11)
RDW: 12.7 % (ref 11.5–15.5)
WBC: 7 10*3/uL (ref 4.0–10.5)
nRBC: 0 % (ref 0.0–0.2)

## 2022-10-19 LAB — RESP PANEL BY RT-PCR (RSV, FLU A&B, COVID)  RVPGX2
Influenza A by PCR: NEGATIVE
Influenza B by PCR: NEGATIVE
Resp Syncytial Virus by PCR: POSITIVE — AB
SARS Coronavirus 2 by RT PCR: NEGATIVE

## 2022-10-19 LAB — BASIC METABOLIC PANEL
Anion gap: 9 (ref 5–15)
BUN: 15 mg/dL (ref 6–20)
CO2: 26 mmol/L (ref 22–32)
Calcium: 9.6 mg/dL (ref 8.9–10.3)
Chloride: 105 mmol/L (ref 98–111)
Creatinine, Ser: 0.61 mg/dL (ref 0.44–1.00)
GFR, Estimated: >60 mL/min (ref >60)
Glucose, Bld: 143 mg/dL — ABNORMAL HIGH (ref 70–99)
Potassium: 3.9 mmol/L (ref 3.5–5.1)
Sodium: 140 mmol/L (ref 135–145)

## 2022-10-19 LAB — TROPONIN I (HIGH SENSITIVITY): Troponin I (High Sensitivity): 4 ng/L (ref <18)

## 2022-10-19 MED ORDER — ACETAMINOPHEN 325 MG PO TABS
650.0000 mg | ORAL_TABLET | Freq: Once | ORAL | Status: AC
  Administered 2022-10-19: 650 mg via ORAL
  Filled 2022-10-19: qty 2

## 2022-10-19 NOTE — ED Provider Notes (Signed)
[facility] Provider Note    Event Date/Time   First MD Initiated Contact with Patient 10/19/22 [phone]     (approximate)   History   Dizziness and Chest Pain   HPI  Janice Colon is a 56 y.o. female with a past medical history of hypertension who presents today for evaluation of nasal congestion and headache for the past 2 days.  Patient reports that she thought it was her blood pressure so she took her blood pressure and felt like it was elevated.  She denies chest pain, shortness of breath, or visual changes.  She denies any known sick contacts.  She has had a slight cough.  No fevers or chills.  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 --      Excl. in GC? --     Most recent vital signs: Vitals:   10/19/22 0600  BP: (!) 141/104  Pulse: 74  Resp: 20  Temp: 98.5 F (36.9 C)  SpO2: 98%    Physical Exam Vitals and nursing note reviewed.  Constitutional:      General: Awake and alert. No acute distress.    Appearance: Normal appearance. The patient is overweight.  HENT:     Head: Normocephalic and atraumatic.     Mouth: Mucous membranes are moist.  Nasal congestion present Eyes:     General: PERRL. Normal EOMs        Right eye: No discharge.        Left eye: No discharge.     Conjunctiva/sclera: Conjunctivae normal.  Cardiovascular:     Rate and Rhythm:  Normal rate and regular rhythm.     Pulses: Normal pulses.     Heart sounds: Normal heart sounds Pulmonary:     Effort: Pulmonary effort is normal. No respiratory distress.     Breath sounds: Normal breath sounds.  Abdominal:     Abdomen is soft. There is no abdominal tenderness. No rebound or guarding. No distention. Musculoskeletal:        General: No swelling. Normal range of motion.     Cervical back: Normal range of motion and neck supple.  Skin:    General: Skin is warm and dry.     Capillary Refill: Capillary refill takes less than 2 seconds.     Findings: No rash.  Neurological:     Mental Status: The patient is awake and alert.  Neurological: GCS 15 alert and oriented x3 Normal speech, no expressive or receptive aphasia or dysarthria Cranial nerves II through XII intact Normal visual fields 5 out of 5 strength in  all 4 extremities with intact sensation throughout No extremity drift Normal finger-to-nose testing, no limb or truncal ataxia     ED Results / Procedures / Treatments   Labs (all labs ordered are listed, but only abnormal results are displayed) Labs Reviewed  RESP PANEL BY RT-PCR (RSV, FLU A&B, COVID)  RVPGX2 - Abnormal; Notable for the following components:      Result Value   Resp Syncytial Virus by PCR POSITIVE (*)    All other components within normal limits  BASIC METABOLIC PANEL - Abnormal; Notable for the following components:   Glucose, Bld 143 (*)    All other components within normal limits  CBC - Abnormal; Notable for the following components:   RBC 5.18 (*)    All other components within normal limits  POC URINE PREG, ED  TROPONIN I (HIGH SENSITIVITY)  TROPONIN I (HIGH SENSITIVITY)     EKG     RADIOLOGY I independently reviewed and interpreted imaging and agree with radiologists findings.     PROCEDURES:  Critical Care performed:   Procedures   MEDICATIONS ORDERED IN ED: Medications  acetaminophen (TYLENOL) tablet 650  mg (650 mg Oral Given 10/19/22 [id number])     IMPRESSION / MDM / ASSESSMENT AND PLAN / ED COURSE  I reviewed the triage vital signs and the nursing notes.   Differential diagnosis includes, but is not limited to, elevated blood pressure, hypertensive urgency, hypertensive emergency, COVID, influenza, RSV, other URI.  Patient is awake and alert, hemodynamically stable and afebrile.  She is mildly elevated blood pressure, though has no focal neurological deficits.  Labs obtained in triage revealed no evidence of endorgan damage.  Her swab was positive for RSV.  We discussed symptomatic management and return precautions.  Patient is reassured by these findings.  She was treated symptomatically with improvement of her symptoms.  She requested a work note which was provided. We discussed return precautions and outpatient follow up. Patient understands and agrees with plan.  Interpretor used for full history, exam, findings, and treatment recommendations.  Patient's presentation is most consistent with acute complicated illness / injury requiring diagnostic workup.    FINAL CLINICAL IMPRESSION(S) / ED DIAGNOSES   Final diagnoses:  RSV (respiratory syncytial virus infection)     Rx / DC Orders   ED Discharge Orders     None        Note:  This document was prepared using Dragon voice recognition software and may include unintentional dictation errors.   [name] 10/19/22 0919    Merlyn Lot, MD 10/19/22 1158

## 2022-10-19 NOTE — Discharge Instructions (Addendum)
You have tested positive for RSV.  You may continue to take Tylenol/ibuprofen per package instructions to help with your symptoms.  Please return for any new, worsening, or change in symptoms or other concerns.  It was a pleasure caring for you today.

## 2022-10-19 NOTE — ED Triage Notes (Signed)
Patient reports headache, elevated blood pressure, chest pressure, dizziness and slightly blurred vision since midnight. States she has had nasal congestion and cough and had a fever approximately one week ago. Patient AOX4, speaking in clear, full sentences. Ambulatory with steady gait with no focal weakness noted. No facial droop observed. Resp even, unlabored on RA.

## 2023-08-27 ENCOUNTER — Other Ambulatory Visit: Payer: Self-pay

## 2023-08-27 ENCOUNTER — Emergency Department: Payer: Self-pay

## 2023-08-27 ENCOUNTER — Emergency Department
Admission: EM | Admit: 2023-08-27 | Discharge: 2023-08-28 | Disposition: A | Discharge status: Home / Self Care | Payer: Self-pay | Attending: Student in an Organized Health Care Education/Training Program | Admitting: Student in an Organized Health Care Education/Training Program

## 2023-08-27 DIAGNOSIS — R1011 Right upper quadrant pain: Secondary | ICD-10-CM | POA: Insufficient documentation

## 2023-08-27 DIAGNOSIS — I10 Essential (primary) hypertension: Secondary | ICD-10-CM | POA: Insufficient documentation

## 2023-08-27 DIAGNOSIS — E119 Type 2 diabetes mellitus without complications: Secondary | ICD-10-CM | POA: Insufficient documentation

## 2023-08-27 DIAGNOSIS — R1013 Epigastric pain: Secondary | ICD-10-CM | POA: Insufficient documentation

## 2023-08-27 HISTORY — DX: Type 2 diabetes mellitus without complications: E11.9

## 2023-08-27 HISTORY — DX: Essential (primary) hypertension: I10

## 2023-08-27 LAB — COMPREHENSIVE METABOLIC PANEL
ALT: 44 U/L (ref 0–44)
AST: 37 U/L (ref 15–41)
Albumin: 4.5 g/dL (ref 3.5–5.0)
Alkaline Phosphatase: 94 U/L (ref 38–126)
Anion gap: 10 (ref 5–15)
BUN: 20 mg/dL (ref 6–20)
CO2: 24 mmol/L (ref 22–32)
Calcium: 9.4 mg/dL (ref 8.9–10.3)
Chloride: 106 mmol/L (ref 98–111)
Creatinine, Ser: 0.62 mg/dL (ref 0.44–1.00)
GFR, Estimated: >60 mL/min (ref >60)
Glucose, Bld: 107 mg/dL — ABNORMAL HIGH (ref 70–99)
Potassium: 3.6 mmol/L (ref 3.5–5.1)
Sodium: 140 mmol/L (ref 135–145)
Total Bilirubin: 0.7 mg/dL (ref 0.3–1.2)
Total Protein: 8.5 g/dL — ABNORMAL HIGH (ref 6.5–8.1)

## 2023-08-27 LAB — CBC
HCT: 41.4 % (ref 36.0–46.0)
Hemoglobin: 13.8 g/dL (ref 12.0–15.0)
MCH: 28.3 pg (ref 26.0–34.0)
MCHC: 33.3 g/dL (ref 30.0–36.0)
MCV: 85 fL (ref 80.0–100.0)
Platelets: 220 10*3/uL (ref 150–400)
RBC: 4.87 MIL/uL (ref 3.87–5.11)
RDW: 12.5 % (ref 11.5–15.5)
WBC: 9.9 10*3/uL (ref 4.0–10.5)
nRBC: 0 % (ref 0.0–0.2)

## 2023-08-27 LAB — URINALYSIS, ROUTINE W REFLEX MICROSCOPIC
Bilirubin Urine: NEGATIVE
Glucose, UA: NEGATIVE mg/dL
Hgb urine dipstick: NEGATIVE
Ketones, ur: NEGATIVE mg/dL
Nitrite: NEGATIVE
Protein, ur: 30 mg/dL — AB
Specific Gravity, Urine: 1.027 (ref 1.005–1.030)
pH: 5 (ref 5.0–8.0)

## 2023-08-27 LAB — TROPONIN I (HIGH SENSITIVITY): Troponin I (High Sensitivity): 3 ng/L (ref <18)

## 2023-08-27 LAB — LIPASE, BLOOD: Lipase: 30 U/L (ref 11–51)

## 2023-08-27 MED ORDER — SODIUM CHLORIDE 0.9 % IV BOLUS
500.0000 mL | Freq: Once | INTRAVENOUS | Status: AC
  Administered 2023-08-27: 500 mL via INTRAVENOUS

## 2023-08-27 MED ORDER — ALUM & MAG HYDROXIDE-SIMETH 200-200-20 MG/5ML PO SUSP
30.0000 mL | Freq: Once | ORAL | Status: AC
  Administered 2023-08-28: 30 mL via ORAL
  Filled 2023-08-27: qty 30

## 2023-08-27 MED ORDER — MORPHINE SULFATE (PF) 4 MG/ML IV SOLN
4.0000 mg | INTRAVENOUS | Status: DC | PRN
  Administered 2023-08-27: 4 mg via INTRAVENOUS
  Filled 2023-08-27: qty 1

## 2023-08-27 MED ORDER — FAMOTIDINE 40 MG PO TABS: 40.0000 mg | ORAL_TABLET | Freq: Every evening | ORAL | 1 refills | Status: AC

## 2023-08-27 MED ORDER — ONDANSETRON HCL 4 MG/2ML IJ SOLN
4.0000 mg | Freq: Once | INTRAMUSCULAR | Status: AC | PRN
  Administered 2023-08-27: 4 mg via INTRAVENOUS
  Filled 2023-08-27: qty 2

## 2023-08-27 MED ORDER — ACETAMINOPHEN 325 MG PO TABS
650.0000 mg | ORAL_TABLET | Freq: Once | ORAL | Status: DC
  Filled 2023-08-27: qty 2

## 2023-08-27 MED ORDER — ONDANSETRON 4 MG PO TBDP: 4.0000 mg | ORAL_TABLET | Freq: Three times a day (TID) | ORAL | 0 refills | Status: AC | PRN

## 2023-08-27 NOTE — ED Provider Notes (Signed)
[facility] Provider Note    Event Date/Time   First MD Initiated Contact with Patient 08/27/23 2145     (approximate)   History   Abdominal Pain   HPI  Janice Colon is a 57 y.o. female with a history of hypertension high cholesterol diabetes presents to the ER for evaluation of progressively worsening right upper quadrant abdominal pain associate with nausea vomiting.  No fevers or chills.  No shortness of breath.  Has been worsening over the past few days.  Does still have her gallbladder.     Physical Exam   Triage Vital Signs: ED Triage Vitals  Encounter Vitals Group     BP 08/27/23 2049 (!) 151/97     Systolic BP Percentile --      Diastolic BP Percentile --      Pulse Rate 08/27/23 2049 95     Resp 08/27/23 2049 20     Temp 08/27/23 2049 99 F (37.2 C)     Temp Source 08/27/23 2049 Oral     SpO2 08/27/23 2049 100 %     Weight 08/27/23 2050 197 lb (89.4 kg)     Height 08/27/23 2050 5\' 4"  (1.626 m)     Head Circumference --      Peak Flow --      Pain Score 08/27/23 2050 10     Pain Loc --      Pain Education --      Exclude from Growth Chart --     Most recent vital signs: Vitals:   08/27/23 2049  BP: (!) 151/97  Pulse: 95  Resp: 20  Temp: 99 F (37.2 C)  SpO2: 100%     Constitutional: Alert  Eyes: Conjunctivae are normal.  Head: Atraumatic. Nose: No congestion/rhinnorhea. Mouth/Throat: Mucous membranes are moist.   Neck: Painless ROM.  Cardiovascular:   Good peripheral circulation. Respiratory: Normal respiratory effort.  No retractions.  Gastrointestinal: Soft a without guarding or rebound but does have tenderness to palpation in the right upper quadrant. Musculoskeletal:  no deformity Neurologic:  MAE spontaneously. No gross focal neurologic deficits are appreciated.  Skin:  Skin is warm, dry and intact. No rash noted. Psychiatric: Mood and affect are normal. Speech and behavior are normal.    ED  Results / Procedures / Treatments   Labs (all labs ordered are listed, but only abnormal results are displayed) Labs Reviewed  COMPREHENSIVE METABOLIC PANEL - Abnormal; Notable for the following components:      Result Value   Glucose, Bld 107 (*)    Total Protein 8.5 (*)    All other components within normal limits  URINALYSIS, ROUTINE W REFLEX MICROSCOPIC - Abnormal; Notable for the following components:   Color, Urine YELLOW (*)    APPearance HAZY (*)    Protein, ur 30 (*)    Leukocytes,Ua TRACE (*)    Bacteria, UA RARE (*)    All other components within normal limits  LIPASE, BLOOD  CBC  POC URINE PREG, ED  TROPONIN I (HIGH SENSITIVITY)  TROPONIN I (HIGH SENSITIVITY)     EKG  ED ECG REPORT I, Willy Eddy, the attending physician, personally viewed and interpreted this ECG.   Date: 08/27/2023  EKG Time: 20:57  Rate: 85  Rhythm: sinus  Axis: normal  Intervals: normal  ST&T Change: no stemi, no depressions    RADIOLOGY Please see ED Course for my review and interpretation.  I personally reviewed all radiographic images ordered  to evaluate for the above acute complaints and reviewed radiology reports and findings.  These findings were personally discussed with the patient.  Please see medical record for radiology report.    PROCEDURES:  Critical Care performed: No  Procedures   MEDICATIONS ORDERED IN ED: Medications  morphine (PF) 4 MG/ML injection 4 mg (4 mg Intravenous Given 08/27/23 2215)  acetaminophen (TYLENOL) tablet 650 mg (650 mg Oral Not Given 08/27/23 2227)  alum & mag hydroxide-simeth (MAALOX/MYLANTA) 200-200-20 MG/5ML suspension 30 mL (has no administration in time range)  ondansetron (ZOFRAN) injection 4 mg (4 mg Intravenous Given 08/27/23 2059)  sodium chloride 0.9 % bolus 500 mL (500 mLs Intravenous New Bag/Given 08/27/23 2211)     IMPRESSION / MDM / ASSESSMENT AND PLAN / ED COURSE  I reviewed the triage vital signs and the nursing  notes.                              Differential diagnosis includes, but is not limited to, cholelithiasis, cholecystitis, cholangitis, gastritis, PUD, pancreatitis, appendicitis, viral illness  Patient presenting to the ER for evaluation of symptoms as described above.  Based on symptoms, risk factors and considered above differential, this presenting complaint could reflect a potentially life-threatening illness therefore the patient will be placed on continuous pulse oximetry and telemetry for monitoring.  Laboratory evaluation will be sent to evaluate for the above complaints.      Clinical Course as of 08/27/23 2319  Fri Aug 27, 2023  2222 Ruq ultraSound on my review and interpretation does not show any evidence of cholelithiasis. [PR]  2314 Patient reassessed.  We discussed results of ultrasound with patient.  Given her normal LFTs normal lipase no fever have a lower suspicion for bile duct obstruction pancreatitis.  No findings to suggest cholecystitis or cholangitis.  Repeat abdominal exam with central epigastric pain.  Suspect gastritis.  Plan will be p.o. challenge.  If she tolerates this I think she will be appropriate for outpatient follow-up will be given referral to GI given the abnormality on right upper quadrant ultrasound.  If unable to tolerate p.o. will order MRCP. [PR]    Clinical Course User Index [PR] Willy Eddy, MD     FINAL CLINICAL IMPRESSION(S) / ED DIAGNOSES   Final diagnoses:  Epigastric pain     Rx / DC Orders   ED Discharge Orders          Ordered    ondansetron (ZOFRAN-ODT) 4 MG disintegrating tablet  Every 8 hours PRN        08/27/23 2318    famotidine (PEPCID) 40 MG tablet  Every evening        08/27/23 2318             Note:  This document was prepared using Dragon voice recognition software and may include unintentional dictation errors.    Willy Eddy, MD 08/27/23 [phone]

## 2023-08-27 NOTE — ED Triage Notes (Signed)
Pt presents with epigastric pain with associated N/V that started today.

## 2023-08-27 NOTE — ED Notes (Signed)
RN called lab to add on Troponin. Lab states they will put into process at this time.

## 2023-08-27 NOTE — ED Notes (Signed)
This RN gave patient 4 mg dose of morphine. Patient began complaining of a headache. MD Roxan Hockey notified and placed a tylenol order. This RN took tylenol to bedside when patient stated her headache was better and she no longer wanted the tylenol.

## 2023-08-28 NOTE — ED Provider Notes (Signed)
  Physical Exam  BP 110/75   Pulse 78   Temp 99 F (37.2 C) (Oral)   Resp 16   Ht 5\' 4"  (1.626 m)   Wt 89.4 kg   SpO2 98%   BMI 33.81 kg/m   Physical Exam  Procedures  Procedures  ED Course / MDM   Clinical Course as of 08/28/23 0035  Fri Aug 27, 2023  2222 Ruq ultraSound on my review and interpretation does not show any evidence of cholelithiasis. [PR]  2314 Patient reassessed.  We discussed results of ultrasound with patient.  Given her normal LFTs normal lipase no fever have a lower suspicion for bile duct obstruction pancreatitis.  No findings to suggest cholecystitis or cholangitis.  Repeat abdominal exam with central epigastric pain.  Suspect gastritis.  Plan will be p.o. challenge.  If she tolerates this I think she will be appropriate for outpatient follow-up will be given referral to GI given the abnormality on right upper quadrant ultrasound.  If unable to tolerate p.o. will order MRCP. [PR]    Clinical Course User Index [PR] Willy Eddy, MD   Medical Decision Making Amount and/or Complexity of Data Reviewed Labs: ordered. Radiology: ordered.  Risk OTC drugs. Prescription drug management.   Received patient in signout.  57 year old female presenting today for right upper quadrant epigastric abdominal pain associated with nausea and vomiting.  Laboratory workup largely reassuring with normal T. bili and LFTs and lipase.  Right upper quadrant ultrasound with no evidence of cholecystitis although there did appear to be some slight common bile duct dilation.  Patient was reassessed by previous provider and plan was for p.o. challenge.  If she tolerated this then she can be discharged.  Patient tolerated p.o. challenge without issue.  Will discharge her with outpatient GI follow-up.  Given strict return precautions.   Janith Lima, MD 08/28/23 [phone]

## 2023-08-28 NOTE — Discharge Instructions (Signed)
Please follow-up with the GI specialist listed in your paperwork for outpatient management.  Please return for any worsening symptoms.

## 2023-08-30 ENCOUNTER — Telehealth: Payer: Self-pay

## 2023-08-30 NOTE — Telephone Encounter (Signed)
Patient called in wanted to speak with someone that speak Spanish. I transfer her Janice Colon. I called the patient back with the [facility] [phone].

## 2023-09-27 ENCOUNTER — Other Ambulatory Visit: Payer: Self-pay

## 2023-09-27 NOTE — Progress Notes (Deleted)
Celso Amy, PA-C [address]  [address]  [place], [place] [address]  Main: [phone]  Fax: [phone]   Gastroenterology Consultation  Referring Provider:     No ref. provider found Primary Care Physician:  Pcp, No Primary Gastroenterologist:  *** Reason for Consultation:     ED follow-up of epigastric pain        HPI:   Janice Colon is a 57 y.o. y/o female referred for consultation & management  by Pcp, No.  We are using interpreter  Here for ED follow-up of epigastric pain.  She went to [facility] ED 08/27/2023 to evaluate RUQ, epigastric pain, with nausea and vomiting..  RUQ ultrasound showed no gallstones.  Labs showed normal lipase, and LFTs.  Normal CMP and CBC.  Hemoglobin 13.8.  08/27/2023: RUQ ultrasound: No gallstones.  Mildly dilated CBD 7 mm.  Multiple hepatic cysts.  No previous GI evaluation.  Past Medical History:  Diagnosis Date   DM (diabetes mellitus) (HCC)    Hypertension     No past surgical history on file.  Prior to Admission medications   Medication Sig Start Date End Date Taking? Authorizing Provider  albuterol (VENTOLIN HFA) 108 (90 Base) MCG/ACT inhaler Inhale into the lungs every 6 (six) hours as needed for wheezing or shortness of breath.    [provider]  budesonide-formoterol (SYMBICORT) 160-4.5 MCG/ACT inhaler Inhale 2 puffs into the lungs 2 (two) times daily.    [provider]  famotidine (PEPCID) 40 MG tablet Take 1 tablet (40 mg total) by mouth every evening. 08/27/23 08/26/24  Willy Eddy, MD  lisinopril (ZESTRIL) 10 MG tablet Take 10 mg by mouth daily.    [provider]  losartan (COZAAR) 25 MG tablet Take 25 mg by mouth daily.    [provider]  meloxicam (MOBIC) 15 MG tablet Take 15 mg by mouth daily. 08/12/23   [provider]  ondansetron (ZOFRAN-ODT) 4 MG disintegrating tablet Take 1 tablet (4 mg total) by mouth every 8 (eight) hours as needed for nausea or  vomiting. 08/27/23   Willy Eddy, MD  Spacer/Aero-Holding Chambers (EASIVENT) inhaler 1 each by Miscellaneous route every two (2) hours as needed. 03/24/22   [provider]    No family history on file.   Social History   Tobacco Use   Smoking status: Never   Smokeless tobacco: Never  Vaping Use   Vaping status: Never Used  Substance Use Topics   Alcohol use: Not Currently   Drug use: Never    Allergies as of 09/28/2023   (No Known Allergies)    Review of Systems:    All systems reviewed and negative except where noted in HPI.   Physical Exam:  There were no vitals taken for this visit. No LMP recorded. Patient is perimenopausal.  General:   Alert,  Well-developed, well-nourished, pleasant and cooperative in NAD Lungs:  Respirations even and unlabored.  Clear throughout to auscultation.   No wheezes, crackles, or rhonchi. No acute distress. Heart:  Regular rate and rhythm; no murmurs, clicks, rubs, or gallops. Abdomen:  Normal bowel sounds.  No bruits.  Soft, and non-distended without masses, hepatosplenomegaly or hernias noted.  No Tenderness.  No guarding or rebound tenderness.    Neurologic:  Alert and oriented x3;  grossly normal neurologically. Psych:  Alert and cooperative. Normal mood and affect.  Imaging Studies: No results found.  Assessment and Plan:   Janice Colon is a 57 y.o.  y/o female has been referred for ED follow-up of RUQ pain, epigastric pain, nausea and vomiting.  CBC, CMP, and lipase labs normal.  RUQ ultrasound showed mildly dilated common bile duct 7 mm.  Multiple hepatic cysts.  No gallstones.  1.  RUQ and epigastric pain  H. pylori breath test  Trial of PPI versus EGD  2.  Nausea vomiting  3.  Mildly dilated common bile duct 7 mm  Abdominal MRI/MRCP  4.  Colon cancer screening  Scheduling Colonoscopy I discussed risks of colonoscopy with patient to include risk of bleeding, colon perforation, and risk of  sedation.  Patient expressed understanding and agrees to proceed with colonoscopy.   Follow up ***  Celso Amy, PA-C    BP check ***

## 2023-09-28 ENCOUNTER — Ambulatory Visit: Payer: Self-pay | Admitting: Physician Assistant

## 2024-04-13 ENCOUNTER — Other Ambulatory Visit: Payer: Self-pay | Admitting: *Deleted

## 2024-04-13 ENCOUNTER — Inpatient Hospital Stay
Admission: RE | Admit: 2024-04-13 | Discharge: 2024-04-13 | Disposition: A | Discharge status: Home / Self Care | Payer: Self-pay | Source: Ambulatory Visit | Attending: Family Medicine | Admitting: Family Medicine

## 2024-04-13 DIAGNOSIS — Z1231 Encounter for screening mammogram for malignant neoplasm of breast: Secondary | ICD-10-CM

## 2024-05-19 ENCOUNTER — Other Ambulatory Visit: Payer: Self-pay

## 2024-05-19 DIAGNOSIS — Z1231 Encounter for screening mammogram for malignant neoplasm of breast: Secondary | ICD-10-CM

## 2024-06-12 ENCOUNTER — Ambulatory Visit: Payer: Self-pay

## 2024-07-31 ENCOUNTER — Ambulatory Visit: Payer: Self-pay

## 2024-09-14 ENCOUNTER — Telehealth: Payer: Self-pay

## 2024-10-31 ENCOUNTER — Emergency Department: Payer: Self-pay

## 2024-10-31 ENCOUNTER — Emergency Department
Admission: EM | Admit: 2024-10-31 | Discharge: 2024-10-31 | Disposition: A | Discharge status: Home / Self Care | Payer: Self-pay | Attending: Emergency Medicine | Admitting: Emergency Medicine

## 2024-10-31 ENCOUNTER — Other Ambulatory Visit: Payer: Self-pay

## 2024-10-31 ENCOUNTER — Encounter: Payer: Self-pay | Admitting: Emergency Medicine

## 2024-10-31 DIAGNOSIS — Z79899 Other long term (current) drug therapy: Secondary | ICD-10-CM | POA: Insufficient documentation

## 2024-10-31 DIAGNOSIS — I10 Essential (primary) hypertension: Secondary | ICD-10-CM | POA: Insufficient documentation

## 2024-10-31 DIAGNOSIS — R519 Headache, unspecified: Secondary | ICD-10-CM

## 2024-10-31 HISTORY — DX: Pure hypercholesterolemia, unspecified: E78.00

## 2024-10-31 LAB — BASIC METABOLIC PANEL WITH GFR
Anion gap: 14 (ref 5–15)
BUN: 10 mg/dL (ref 6–20)
CO2: 25 mmol/L (ref 22–32)
Calcium: 9.6 mg/dL (ref 8.9–10.3)
Chloride: 102 mmol/L (ref 98–111)
Creatinine, Ser: 0.72 mg/dL (ref 0.44–1.00)
GFR, Estimated: >60 mL/min
Glucose, Bld: 194 mg/dL — ABNORMAL HIGH (ref 70–99)
Potassium: 3.9 mmol/L (ref 3.5–5.1)
Sodium: 141 mmol/L (ref 135–145)

## 2024-10-31 LAB — CBC
HCT: 43.3 % (ref 36.0–46.0)
Hemoglobin: 14.2 g/dL (ref 12.0–15.0)
MCH: 28.5 pg (ref 26.0–34.0)
MCHC: 32.8 g/dL (ref 30.0–36.0)
MCV: 86.9 fL (ref 80.0–100.0)
Platelets: 212 K/uL (ref 150–400)
RBC: 4.98 MIL/uL (ref 3.87–5.11)
RDW: 12.6 % (ref 11.5–15.5)
WBC: 8.8 K/uL (ref 4.0–10.5)
nRBC: 0 % (ref 0.0–0.2)

## 2024-10-31 LAB — TROPONIN T, HIGH SENSITIVITY: Troponin T High Sensitivity: <15 ng/L (ref 0–19)

## 2024-10-31 MED ORDER — BUTALBITAL-APAP-CAFFEINE 50-325-40 MG PO TABS
2.0000 | ORAL_TABLET | Freq: Once | ORAL | Status: AC
  Administered 2024-10-31: 2 via ORAL
  Filled 2024-10-31: qty 2

## 2024-10-31 MED ORDER — ONDANSETRON HCL 4 MG/2ML IJ SOLN
4.0000 mg | Freq: Once | INTRAMUSCULAR | Status: AC
  Administered 2024-10-31: 4 mg via INTRAVENOUS
  Filled 2024-10-31: qty 2

## 2024-10-31 MED ORDER — KETOROLAC TROMETHAMINE 30 MG/ML IJ SOLN
15.0000 mg | Freq: Once | INTRAMUSCULAR | Status: AC
  Administered 2024-10-31: 15 mg via INTRAVENOUS
  Filled 2024-10-31: qty 1

## 2024-10-31 NOTE — ED Triage Notes (Signed)
 3 days of high blood pressure, also 3 days of N/V and headache and today also back pain to left scapular area.  Also c/o feeling heart palpitations.  AAOx3. Skin warm and dry. NAD

## 2024-10-31 NOTE — ED Provider Notes (Signed)
 "  [facility] Provider Note    None    (approximate)   History   Hypertension   HPI  Janice Colon is a 58 y.o. female who presents to the ED for evaluation of Hypertension   Patient with history of hypertension on losartan presents to the ED with her daughter-in-law for evaluation of 3 days of nausea without emesis or abdominal pain, intermittent dizziness, headaches and malaise.  Checking her blood pressure in the setting of the symptoms and elevated with systolics to the 150s.  Due to palpitations this afternoon which precipitated the visit to the ED.  No chest pain, syncope..  Primarily reporting headache when I see her   Physical Exam   Triage Vital Signs: ED Triage Vitals  Encounter Vitals Group     BP 10/31/24 1852 (!) 173/105     Girls Systolic BP Percentile --      Girls Diastolic BP Percentile --      Boys Systolic BP Percentile --      Boys Diastolic BP Percentile --      Pulse Rate 10/31/24 1852 93     Resp 10/31/24 1852 16     Temp 10/31/24 1852 99 F (37.2 C)     Temp Source 10/31/24 1852 Oral     SpO2 10/31/24 1852 95 %     Weight 10/31/24 1853 197 lb 1.5 oz (89.4 kg)     Height --      Head Circumference --      Peak Flow --      Pain Score 10/31/24 1853 9     Pain Loc --      Pain Education --      Exclude from Growth Chart --     Most recent vital signs: Vitals:   10/31/24 2059 10/31/24 2319  BP: (!) 143/92 118/84  Pulse: 89 75  Resp: 16 16  Temp:    SpO2: 99% 98%    General: Awake, no distress.  CV:  Good peripheral perfusion.  Resp:  Normal effort.  Abd:  No distention.  MSK:  No deformity noted.  Neuro:  No focal deficits appreciated. Cranial nerves II through XII intact 5/5 strength and sensation in all 4 extremities Other:     ED Results / Procedures / Treatments   Labs (all labs ordered are listed, but only abnormal results are displayed) Labs Reviewed  BASIC METABOLIC PANEL WITH GFR -  Abnormal; Notable for the following components:      Result Value   Glucose, Bld 194 (*)    All other components within normal limits  CBC  TROPONIN T, HIGH SENSITIVITY  TROPONIN T, HIGH SENSITIVITY    EKG Sinus rhythm with a rate of 92 bpm.  Normal axis and intervals.  No clear signs of acute ischemia.  RADIOLOGY CXR interpreted by me without evidence of acute cardiopulmonary pathology. CT head interpreted by me without evidence of acute intracranial pathology  Official radiology report(s): CT HEAD WO CONTRAST ( ) Result Date: 10/31/2024 EXAM: CT HEAD WITHOUT CONTRAST 10/31/2024 09:33:35 PM TECHNIQUE: CT of the head was performed without the administration of intravenous contrast. Automated exposure control, iterative reconstruction, and/or weight based adjustment of the mA/kV was utilized to reduce the radiation dose to as low as reasonably achievable. COMPARISON: None available. CLINICAL HISTORY: HTN, headache. FINDINGS: BRAIN AND VENTRICLES: No acute hemorrhage. No evidence of acute infarct. No hydrocephalus. No extra-axial collection. No mass effect or midline shift. Bilateral cerebral  parenchymal calcifications. ORBITS: No acute abnormality. SINUSES: Retention cysts in bilateral sphenoid sinuses. SOFT TISSUES AND SKULL: No acute soft tissue abnormality. No skull fracture. IMPRESSION: 1. No acute intracranial abnormality. Electronically signed by: Franky Stanford MD 10/31/2024 09:58 PM EST RP Workstation: [id number]   DG Chest 2 View Result Date: 10/31/2024 EXAM: 2 VIEW(S) XRAY OF THE CHEST 10/31/2024 07:20:00 PM COMPARISON: 08/27/2023 CLINICAL HISTORY: htn, palpitations FINDINGS: LUNGS AND PLEURA: No focal pulmonary opacity. No pleural effusion. No pneumothorax. HEART AND MEDIASTINUM: No acute abnormality of the cardiac and mediastinal silhouettes. BONES AND SOFT TISSUES: No acute osseous abnormality. IMPRESSION: 1. No acute process. Electronically signed by: Dorethia Molt MD 10/31/2024  08:32 PM EST RP Workstation: [id number]    PROCEDURES and INTERVENTIONS:  .1-3 Lead EKG Interpretation  Performed by: Claudene Rover, MD Authorized by: Claudene Rover, MD     Interpretation: normal     ECG rate:  84   ECG rate assessment: normal     Rhythm: sinus rhythm     Ectopy: none     Conduction: normal     Medications  butalbital -acetaminophen -caffeine  (FIORICET ) 50-325-40 MG per tablet 2 tablet (2 tablets Oral Given 10/31/24 2119)  ketorolac  (TORADOL ) 30 MG/ML injection 15 mg (15 mg Intravenous Given 10/31/24 2120)  ondansetron  (ZOFRAN ) injection 4 mg (4 mg Intravenous Given 10/31/24 2120)     IMPRESSION / MDM / ASSESSMENT AND PLAN / ED COURSE  I reviewed the triage vital signs and the nursing notes.  Differential diagnosis includes, but is not limited to, ICH, stroke, ACS, cardiac dysrhythmia, renal dysfunction  {Patient presents with symptoms of an acute illness or injury that is potentially life-threatening.  Patient presents with concerns for hypertension, headache in the setting of about 3 days of generalized illness that may represent a viral syndrome.  No signs of endorgan damage from her elevated blood pressure.  Normal CBC, negative troponin.  Normal renal function, mild hyperglycemia without acidosis.  Clear CXR without dysrhythmia on the monitor.  Will obtain a CT head, provide nonnarcotic analgesia and reassess.  Suspect she will be suitable for outpatient management.  CT reassuring, proving symptoms.      FINAL CLINICAL IMPRESSION(S) / ED DIAGNOSES   Final diagnoses:  Bad headache  Hypertension, unspecified type     Rx / DC Orders   ED Discharge Orders     None        Note:  This document was prepared using Dragon voice recognition software and may include unintentional dictation errors.   Claudene Rover, MD 10/31/24 [phone]  "
